# Patient Record
Sex: Male | Born: 1995 | Race: White | Hispanic: No | Marital: Single | Smoking: Current every day smoker
Health system: Southern US, Community
[De-identification: ages and names within clinical notes are randomized; demographics above are authoritative.]

---
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
## 2014-10-21 ENCOUNTER — Encounter (HOSPITAL_BASED_OUTPATIENT_CLINIC_OR_DEPARTMENT_OTHER): Payer: Self-pay | Admitting: *Deleted

## 2014-10-21 ENCOUNTER — Emergency Department (HOSPITAL_BASED_OUTPATIENT_CLINIC_OR_DEPARTMENT_OTHER)
Admission: EM | Admit: 2014-10-21 | Discharge: 2014-10-21 | Disposition: A | Discharge status: Home / Self Care | Payer: BLUE CROSS/BLUE SHIELD | Attending: Emergency Medicine | Admitting: Emergency Medicine

## 2014-10-21 DIAGNOSIS — H16133 Photokeratitis, bilateral: Secondary | ICD-10-CM | POA: Diagnosis not present

## 2014-10-21 DIAGNOSIS — Z720 Tobacco use: Secondary | ICD-10-CM | POA: Diagnosis not present

## 2014-10-21 DIAGNOSIS — H5713 Ocular pain, bilateral: Secondary | ICD-10-CM | POA: Diagnosis present

## 2014-10-21 MED ORDER — FLUORESCEIN SODIUM 1 MG OP STRP
ORAL_STRIP | OPHTHALMIC | Status: AC
  Filled 2014-10-21: qty 2

## 2014-10-21 MED ORDER — TETRACAINE HCL 0.5 % OP SOLN
OPHTHALMIC | Status: AC
  Administered 2014-10-21: 2 [drp]
  Filled 2014-10-21: qty 2

## 2014-10-21 MED ORDER — OXYCODONE-ACETAMINOPHEN 5-325 MG PO TABS
2.0000 | ORAL_TABLET | Freq: Once | ORAL | Status: AC
  Administered 2014-10-21: 2 via ORAL
  Filled 2014-10-21: qty 2

## 2014-10-21 MED ORDER — FLUORESCEIN SODIUM 1 MG OP STRP: 2.0000 | ORAL_STRIP | Freq: Once | OPHTHALMIC | Status: DC

## 2014-10-21 MED ORDER — OXYCODONE-ACETAMINOPHEN 5-325 MG PO TABS: 1.0000 | ORAL_TABLET | ORAL | Status: AC | PRN

## 2014-10-21 NOTE — ED Provider Notes (Signed)
CSN: [id number]     Arrival date & time 10/21/14  2048 History   First MD Initiated Contact with Patient 10/21/14 2102     Chief Complaint  Patient presents with  . Eye Pain     (Consider location/radiation/quality/duration/timing/severity/associated sxs/prior Treatment) HPI Comments: Patient presents with bilateral eye pain, redness, and tearing which started early this morning. 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 He denies any foreign bodies into his eye. No nausea or vomiting. No vision change although it is hard for him to see because of photophobia and tearing of his eye. No crusting noted. Pain is severe. Onset of symptoms acute. Course is constant. No treatments prior to arrival. No history of eye surgeries or other severe injuries.  The history is provided by the patient.    History reviewed. No pertinent past medical history. History reviewed. No pertinent past surgical history. No family history on file. History  Substance Use Topics  . Smoking status: Current Every Day Smoker -- 1.00 packs/day    Types: Cigarettes  . Smokeless tobacco: Not on file  . Alcohol Use: Yes     Comment: weekly    Review of Systems  Constitutional: Negative for fever and chills.  HENT: Negative for rhinorrhea.   Eyes: Positive for photophobia, pain, discharge (tearing) and redness. Negative for itching and visual disturbance.  Gastrointestinal: Negative for nausea and vomiting.  Genitourinary: Negative for dysuria.  Musculoskeletal: Negative for myalgias.  Skin: Negative for rash.  Neurological: Negative for headaches.      Allergies  Review of patient's allergies indicates no known allergies.  Home Medications   Prior to Admission medications   Not on File   BP 130/85 mmHg  Pulse 77  Temp(Src) 98.6 F (37 C) (Oral)  Resp 20  Ht 6' (1.829 m)  Wt 145 lb  (65.772 kg)  BMI 19.66 kg/m2  SpO2 100% Physical Exam  Constitutional: He appears well-developed and well-nourished.  HENT:  Head: Normocephalic and atraumatic.  Right Ear: External ear normal.  Left Ear: External ear normal.  Nose: Nose normal.  Mouth/Throat: Oropharynx is clear and moist.  Eyes: EOM and lids are normal. Pupils are equal, round, and reactive to light. Right eye exhibits no discharge. Left eye exhibits no discharge. Right conjunctiva is injected. Right conjunctiva has no hemorrhage. Left conjunctiva is injected. Left conjunctiva has no hemorrhage.  Slit lamp exam:      The right eye shows no corneal abrasion, no corneal ulcer, no foreign body and no fluorescein uptake.       The left eye shows no corneal abrasion, no corneal ulcer, no foreign body and no fluorescein uptake.  Neck: Normal range of motion. Neck supple.  Pulmonary/Chest: No respiratory distress.  Neurological: He is alert.  Skin: Skin is warm and dry.  Psychiatric: He has a normal mood and affect.  Nursing note and vitals reviewed.   ED Course  Procedures (including critical care time) Labs Review Labs Reviewed - No data to display  Imaging Review No results found.   EKG Interpretation None       Patient seen and examined. Medications ordered. Patient treated in ED with tetracaine which improved pain temporarily.  Vital signs reviewed and are as follows: BP 130/85 mmHg  Pulse 77  Temp(Src) 98.6 F (37 C) (Oral)  Resp 20  Ht 6' (1.829 m)  Wt 145 lb (65.772 kg)  BMI 19.66 kg/m2  SpO2 100%  Two drops of tetracaine instilled into affected eyes.   Fluorescein strip applied to affected eye. Wood's lamp used to assess for corneal abrasion. No corneal abrasion identified. No foreign bodies noted. No visible hyphema.   Patient tolerated procedure well without immediate complication.    Patient counseled on UV keratitis and expected course. Ophthalmology referral given. Will discharge to  home with pain medications.  Patient counseled on use of narcotic pain medications. Counseled not to combine these medications with others containing tylenol. Urged not to drink alcohol, drive, or perform any other activities that requires focus while taking these medications. The patient verbalizes understanding and agrees with the plan.   MDM   Final diagnoses:  Photokeratitis of both eyes   Patient with history and exam consistent with photokeratitis. No foreign bodies noted. No surrounding erythema, swelling, vision changes/loss suspicious for orbital or periorbital cellulitis. No signs of iritis. No signs of glaucoma. No symptoms of retinal detachment. No ophthalmologic emergency suspected. Outpatient referral given in case of no improvement. Symptom control given in ED and for home.  No dangerous or life-threatening conditions suspected or identified by history, physical exam, and by work-up. No indications for hospitalization identified.     Renne Crigler, PA-C 10/21/14 2231  Vanetta Mulders, MD 10/21/14 [phone]

## 2014-10-21 NOTE — Discharge Instructions (Signed)
Please read and follow all provided instructions.  Your diagnoses today include:  1. Photokeratitis of both eyes     Tests performed today include:  Fluorescein dye examination to look for scratches on your eye  Vital signs. See below for your results today.   Medications prescribed:   Percocet (oxycodone/acetaminophen) - narcotic pain medication  DO NOT drive or perform any activities that require you to be awake and alert because this medicine can make you drowsy. BE VERY CAREFUL not to take multiple medicines containing Tylenol (also called acetaminophen). Doing so can lead to an overdose which can damage your liver and cause liver failure and possibly death.  Take any prescribed medications only as directed.  Home care instructions:  Follow any educational materials contained in this packet. If you wear contact lenses, do not use them until your eye caregiver approves. See your caregiver or eye specialist as suggested for followup.   Follow-up instructions: Please follow-up with your primary care doctor OR the opthalmologist listed in the next 2-3 days for further evaluation of your symptoms.  Return instructions:   Please return to the Emergency Department if you experience worsening symptoms.   Please return immediately if you develop severe pain, pus drainage, new change in vision, or fever.  Please return if you have any other emergent concerns.  Additional Information:  Your vital signs today were: BP 130/85 mmHg   Pulse 77   Temp(Src) 98.6 F (37 C) (Oral)   Resp 20   Ht 6' (1.829 m)   Wt 145 lb (65.772 kg)   BMI 19.66 kg/m2   SpO2 100% If your blood pressure (BP) was elevated above 135/85 this visit, please have this repeated by your doctor within one month. ---------------

## 2014-10-21 NOTE — ED Notes (Signed)
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
